# Patient Record
Sex: Female | Born: 1960
Health system: Southern US, Community
[De-identification: ages and names within clinical notes are randomized; demographics above are authoritative.]

## PROBLEM LIST (undated history)

## (undated) DIAGNOSIS — R011 Cardiac murmur, unspecified: Secondary | ICD-10-CM

## (undated) DIAGNOSIS — F32A Depression, unspecified: Secondary | ICD-10-CM

## (undated) DIAGNOSIS — F329 Major depressive disorder, single episode, unspecified: Secondary | ICD-10-CM

## (undated) DIAGNOSIS — T7840XA Allergy, unspecified, initial encounter: Secondary | ICD-10-CM

## (undated) DIAGNOSIS — M1990 Unspecified osteoarthritis, unspecified site: Secondary | ICD-10-CM

## (undated) DIAGNOSIS — F419 Anxiety disorder, unspecified: Secondary | ICD-10-CM

## (undated) HISTORY — PX: ABDOMINAL HYSTERECTOMY: SHX81

## (undated) HISTORY — DX: Allergy, unspecified, initial encounter: T78.40XA

## (undated) HISTORY — DX: Major depressive disorder, single episode, unspecified: F32.9

## (undated) HISTORY — DX: Cardiac murmur, unspecified: R01.1

## (undated) HISTORY — DX: Anxiety disorder, unspecified: F41.9

## (undated) HISTORY — DX: Unspecified osteoarthritis, unspecified site: M19.90

## (undated) HISTORY — DX: Depression, unspecified: F32.A

## (undated) HISTORY — PX: HERNIA REPAIR: SHX51

---

## 2006-04-11 ENCOUNTER — Emergency Department: Payer: Self-pay | Admitting: Internal Medicine

## 2007-04-25 ENCOUNTER — Other Ambulatory Visit: Payer: Self-pay

## 2007-04-25 ENCOUNTER — Emergency Department: Payer: Self-pay | Admitting: Emergency Medicine

## 2010-04-07 ENCOUNTER — Ambulatory Visit: Payer: Self-pay | Admitting: Family Medicine

## 2013-04-23 ENCOUNTER — Ambulatory Visit: Payer: Self-pay | Admitting: Family Medicine

## 2013-04-24 ENCOUNTER — Ambulatory Visit: Payer: Self-pay | Admitting: Family Medicine

## 2013-08-09 ENCOUNTER — Ambulatory Visit: Payer: Self-pay | Admitting: Gastroenterology

## 2014-08-26 ENCOUNTER — Other Ambulatory Visit: Payer: Self-pay | Admitting: Obstetrics and Gynecology

## 2014-08-26 DIAGNOSIS — Z139 Encounter for screening, unspecified: Secondary | ICD-10-CM

## 2014-08-29 ENCOUNTER — Ambulatory Visit
Admission: RE | Admit: 2014-08-29 | Discharge: 2014-08-29 | Disposition: A | Discharge status: Home / Self Care | Payer: PRIVATE HEALTH INSURANCE | Source: Ambulatory Visit | Attending: Obstetrics and Gynecology | Admitting: Obstetrics and Gynecology

## 2014-08-29 DIAGNOSIS — Z1231 Encounter for screening mammogram for malignant neoplasm of breast: Secondary | ICD-10-CM | POA: Insufficient documentation

## 2014-08-29 DIAGNOSIS — Z139 Encounter for screening, unspecified: Secondary | ICD-10-CM

## 2014-11-27 ENCOUNTER — Other Ambulatory Visit: Payer: Self-pay | Admitting: Family Medicine

## 2015-03-29 ENCOUNTER — Other Ambulatory Visit: Payer: Self-pay | Admitting: Family Medicine

## 2015-04-16 ENCOUNTER — Other Ambulatory Visit: Payer: Self-pay | Admitting: Family Medicine

## 2015-04-16 DIAGNOSIS — J309 Allergic rhinitis, unspecified: Secondary | ICD-10-CM

## 2015-04-16 MED ORDER — CETIRIZINE HCL 10 MG PO TABS: 10.0000 mg | ORAL_TABLET | Freq: Every day | ORAL | Status: DC

## 2015-04-20 ENCOUNTER — Telehealth: Payer: Self-pay | Admitting: Family Medicine

## 2015-04-20 NOTE — Telephone Encounter (Signed)
Pt called saying she seen you at [facility] on Thursday and you gave her an allergy medication .   hse said her insurance will not pay for it.  Please try something different.  She uses [facility]

## 2015-04-21 MED ORDER — LORATADINE 10 MG PO TABS: 10.0000 mg | ORAL_TABLET | Freq: Every day | ORAL | Status: DC

## 2015-04-21 NOTE — Telephone Encounter (Signed)
Will need to try Claritin(loratadine) with OTC Flonase nasal spray at bedtime.

## 2015-04-22 NOTE — Telephone Encounter (Signed)
Patient advised as directed below. 

## 2015-07-30 ENCOUNTER — Other Ambulatory Visit: Payer: Self-pay | Admitting: Family Medicine

## 2015-08-27 ENCOUNTER — Other Ambulatory Visit: Payer: Self-pay | Admitting: Obstetrics and Gynecology

## 2015-08-27 DIAGNOSIS — Z1231 Encounter for screening mammogram for malignant neoplasm of breast: Secondary | ICD-10-CM

## 2015-09-04 ENCOUNTER — Ambulatory Visit
Admission: RE | Admit: 2015-09-04 | Discharge: 2015-09-04 | Disposition: A | Discharge status: Home / Self Care | Payer: PRIVATE HEALTH INSURANCE | Source: Ambulatory Visit | Attending: Obstetrics and Gynecology | Admitting: Obstetrics and Gynecology

## 2015-09-04 DIAGNOSIS — Z1231 Encounter for screening mammogram for malignant neoplasm of breast: Secondary | ICD-10-CM

## 2015-09-07 DIAGNOSIS — I351 Nonrheumatic aortic (valve) insufficiency: Secondary | ICD-10-CM | POA: Insufficient documentation

## 2015-10-29 ENCOUNTER — Other Ambulatory Visit: Payer: Self-pay | Admitting: Family Medicine

## 2015-10-29 MED ORDER — ALBUTEROL SULFATE HFA 108 (90 BASE) MCG/ACT IN AERS: 2.0000 | INHALATION_SPRAY | Freq: Four times a day (QID) | RESPIRATORY_TRACT | 3 refills | Status: AC | PRN

## 2015-10-29 NOTE — Progress Notes (Signed)
Having flare of wheezing/asthma over the past week. Still taking allergy and asthma medications but ran out of the Albuterol. Recheck if having any fever or congestion. No wheezing at the present at [facility].

## 2015-11-30 ENCOUNTER — Other Ambulatory Visit: Payer: Self-pay | Admitting: Family Medicine

## 2016-03-24 ENCOUNTER — Other Ambulatory Visit: Payer: Self-pay | Admitting: Family Medicine

## 2016-03-24 MED ORDER — LORATADINE 10 MG PO TABS: 10.0000 mg | ORAL_TABLET | Freq: Every day | ORAL | 11 refills | Status: DC

## 2016-03-24 MED ORDER — TRAZODONE HCL 150 MG PO TABS: 150.0000 mg | ORAL_TABLET | Freq: Every day | ORAL | 6 refills | Status: DC

## 2016-03-31 ENCOUNTER — Other Ambulatory Visit: Payer: Self-pay | Admitting: Family Medicine

## 2016-03-31 DIAGNOSIS — M545 Low back pain, unspecified: Secondary | ICD-10-CM

## 2016-03-31 MED ORDER — CYCLOBENZAPRINE HCL 10 MG PO TABS: 5.0000 mg | ORAL_TABLET | Freq: Three times a day (TID) | ORAL | 0 refills | Status: DC | PRN

## 2016-03-31 MED ORDER — MELOXICAM 15 MG PO TABS: 15.0000 mg | ORAL_TABLET | Freq: Every day | ORAL | 0 refills | Status: DC

## 2016-03-31 NOTE — Progress Notes (Signed)
Started having acute low back pain/spasm on 03-27-16. No known injury. Examined at [facility] and having tenderness in the right lower back muscles without radiation, numbness or weakness. Treated with Meloxicam 15 mg qd and Cyclobenzaprine 10 mg TID with stretching exercises and moist heat applications. Will recheck at the [facility] in a week if no better. May need physical therapy referral.

## 2016-04-21 ENCOUNTER — Other Ambulatory Visit: Payer: Self-pay | Admitting: Family Medicine

## 2016-04-21 DIAGNOSIS — J0100 Acute maxillary sinusitis, unspecified: Secondary | ICD-10-CM

## 2016-04-21 DIAGNOSIS — J40 Bronchitis, not specified as acute or chronic: Secondary | ICD-10-CM

## 2016-04-21 MED ORDER — AMOXICILLIN-POT CLAVULANATE 875-125 MG PO TABS: 1.0000 | ORAL_TABLET | Freq: Two times a day (BID) | ORAL | 0 refills | Status: DC

## 2016-04-21 MED ORDER — PREDNISONE 5 MG PO TABS: 5.0000 mg | ORAL_TABLET | Freq: Every day | ORAL | 0 refills | Status: DC

## 2016-09-07 ENCOUNTER — Other Ambulatory Visit: Payer: Self-pay | Admitting: Obstetrics and Gynecology

## 2016-09-07 DIAGNOSIS — Z1231 Encounter for screening mammogram for malignant neoplasm of breast: Secondary | ICD-10-CM

## 2016-09-19 ENCOUNTER — Ambulatory Visit
Admission: RE | Admit: 2016-09-19 | Discharge: 2016-09-19 | Disposition: A | Discharge status: Home / Self Care | Payer: PRIVATE HEALTH INSURANCE | Source: Ambulatory Visit | Attending: Obstetrics and Gynecology | Admitting: Obstetrics and Gynecology

## 2016-09-19 DIAGNOSIS — Z1231 Encounter for screening mammogram for malignant neoplasm of breast: Secondary | ICD-10-CM | POA: Insufficient documentation

## 2016-10-25 ENCOUNTER — Other Ambulatory Visit: Payer: Self-pay | Admitting: Family Medicine

## 2017-07-25 ENCOUNTER — Other Ambulatory Visit: Payer: Self-pay | Admitting: Family Medicine

## 2017-07-25 MED ORDER — TRAZODONE HCL 150 MG PO TABS: 150.0000 mg | ORAL_TABLET | Freq: Every day | ORAL | 1 refills | Status: DC

## 2017-07-25 NOTE — Telephone Encounter (Signed)
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

## 2017-07-28 ENCOUNTER — Telehealth: Payer: Self-pay | Admitting: Family Medicine

## 2017-07-28 MED ORDER — TRAZODONE HCL 150 MG PO TABS: 150.0000 mg | ORAL_TABLET | Freq: Every day | ORAL | 0 refills | Status: DC

## 2017-07-28 NOTE — Telephone Encounter (Signed)
Patient advised. Trazodone RX sent to [facility]. Re-establish care appointment scheduled for 08/03/17

## 2017-07-28 NOTE — Telephone Encounter (Signed)
Can refill Trazodone 150 mg qd #30 until seen in the office to go over smoking cessation options and recheck depression.

## 2017-07-28 NOTE — Telephone Encounter (Signed)
Pt called first of the week asking if she could get a refill on trazodone.  Maurine Minister use to fill it for her when he came to [place].  She use to be a patient at [place] and here a long time ago.  Can we establish her as a patient and can she get a refill on the trazodone 150 mg.  She also wants something to help her quit smoking.  She uses [facility]  Pt's call back is [phone]  Barth Kirks

## 2017-08-02 ENCOUNTER — Other Ambulatory Visit: Payer: Self-pay

## 2017-08-03 ENCOUNTER — Encounter: Payer: Self-pay | Admitting: Family Medicine

## 2017-08-03 ENCOUNTER — Ambulatory Visit: Payer: 59 | Admitting: Family Medicine

## 2017-08-03 VITALS — BP 120/80 | HR 68 | Temp 98.2°F | Resp 16 | Ht 61.25 in | Wt 130.0 lb

## 2017-08-03 DIAGNOSIS — J301 Allergic rhinitis due to pollen: Secondary | ICD-10-CM

## 2017-08-03 DIAGNOSIS — J449 Chronic obstructive pulmonary disease, unspecified: Secondary | ICD-10-CM | POA: Diagnosis not present

## 2017-08-03 DIAGNOSIS — Z720 Tobacco use: Secondary | ICD-10-CM | POA: Diagnosis not present

## 2017-08-03 DIAGNOSIS — Z8739 Personal history of other diseases of the musculoskeletal system and connective tissue: Secondary | ICD-10-CM

## 2017-08-03 DIAGNOSIS — F418 Other specified anxiety disorders: Secondary | ICD-10-CM

## 2017-08-03 MED ORDER — CYCLOBENZAPRINE HCL 10 MG PO TABS: 5.0000 mg | ORAL_TABLET | Freq: Three times a day (TID) | ORAL | 0 refills | Status: AC | PRN

## 2017-08-03 MED ORDER — MELOXICAM 15 MG PO TABS
15.0000 mg | ORAL_TABLET | Freq: Every day | ORAL | 0 refills | Status: DC
Start: 2017-08-03 — End: 2022-05-05

## 2017-08-03 NOTE — Progress Notes (Signed)
Patient: Erin EvenerCynthia J Pickrell, Female    DOB: 1960/07/24, 57 y.o.   MRN: [id number] Visit Date: 08/03/2017  Today's Provider: Dortha Kernennis Jema Deegan, PA   Chief Complaint  Patient presents with  . New Patient (Initial Visit)   Subjective:   Re-establish: patient here today to re-establish patient reports feeling well. Patient reports she was seen by Maurine Ministerennis at [facility]. Patient reports that she will need for Maurine MinisterDennis to take over her prescription medications refills.   Patient also requesting help to quit smoking. Patient reports she restarted smoking about 11 years ago and has been smoking for a total of 25 years. Patient reports she smokes about a pack a day.   -----------------------------------------------------------------   Review of Systems  Constitutional: Negative.   HENT: Positive for sinus pressure, sneezing and voice change.   Eyes: Negative.   Respiratory: Negative.   Cardiovascular: Negative.   Gastrointestinal: Negative.   Endocrine: Negative.   Genitourinary: Negative.   Musculoskeletal: Positive for back pain.  Skin: Negative.   Allergic/Immunologic: Positive for environmental allergies.  Neurological: Negative.   Hematological: Negative.   Psychiatric/Behavioral: Negative.     Social History      She  reports that she has been smoking.  She has a 25.00 pack-year smoking history. She has never used smokeless tobacco. She reports that she does not drink alcohol.       Social History   Socioeconomic History  . Marital status: Divorced    Spouse name: Not on file  . Number of children: Not on file  . Years of education: Not on file  . Highest education level: Not on file  Occupational History  . Not on file  Social Needs  . Financial resource strain: Not on file  . Food insecurity:    Worry: Not on file    Inability: Not on file  . Transportation needs:    Medical: Not on file    Non-medical: Not on file  Tobacco Use  . Smoking status: Current Every  Day Smoker    Packs/day: 1.00    Years: 25.00    Pack years: 25.00  . Smokeless tobacco: Never Used  Substance and Sexual Activity  . Alcohol use: Never    Frequency: Never  . Drug use: Not on file  . Sexual activity: Not on file  Lifestyle  . Physical activity:    Days per week: Not on file    Minutes per session: Not on file  . Stress: Not on file  Relationships  . Social connections:    Talks on phone: Not on file    Gets together: Not on file    Attends religious service: Not on file    Active member of club or organization: Not on file    Attends meetings of clubs or organizations: Not on file    Relationship status: Not on file  Other Topics Concern  . Not on file  Social History Narrative  . Not on file   No past medical history on file.  Patient Active Problem List   Diagnosis Date Noted  . Aortic ejection murmur 09/07/2015   No past surgical history on file.  Family History        Family Status  Relation Name Status  . Mother  Deceased  . Father  Alive  . Sister  Alive  . Brother  Alive  . Son  Alive  . Neg Hx  (Not Specified)  Her family history includes Cancer in her mother; Heart disease in her father. There is no history of Breast cancer.     No Known Allergies  Current Outpatient Medications:  .  albuterol (PROVENTIL HFA;VENTOLIN HFA) 108 (90 Base) MCG/ACT inhaler, Inhale 2 puffs into the lungs every 6 (six) hours as needed for wheezing or shortness of breath., Disp: 1 Inhaler, Rfl: 3 .  cyclobenzaprine (FLEXERIL) 10 MG tablet, Take 0.5 tablets (5 mg total) by mouth 3 (three) times daily as needed for muscle spasms., Disp: 21 tablet, Rfl: 0 .  estradiol (ESTRACE) 1 MG tablet, TAKE ONE TABLET BY MOUTH ONCE DAILY, Disp: , Rfl:  .  HYDROcodone-acetaminophen (NORCO/VICODIN) 5-325 MG tablet, Take by mouth., Disp: , Rfl:  .  hydrOXYzine (VISTARIL) 25 MG capsule, Take 25 mg by mouth daily. As needed for Anxiety, Disp: , Rfl:  .  Magnesium 250 MG  TABS, Take by mouth., Disp: , Rfl:  .  meloxicam (MOBIC) 15 MG tablet, Take 1 tablet (15 mg total) by mouth daily., Disp: 30 tablet, Rfl: 0 .  Multiple Vitamin (MULTI-VITAMINS) TABS, Take by mouth., Disp: , Rfl:  .  traZODone (DESYREL) 150 MG tablet, Take 1 tablet (150 mg total) by mouth at bedtime., Disp: 30 tablet, Rfl: 0 .  vitamin B-12 (CYANOCOBALAMIN) 1000 MCG tablet, Take by mouth., Disp: , Rfl:    Patient Care Team: Shirley Bolle, Jodell Cipro, PA as PCP - General (Family Medicine)      Objective:   Vitals: BP 120/80 (BP Location: Right Arm, Patient Position: Sitting, Cuff Size: Normal)   Pulse 68   Temp 98.2 F (36.8 C)   Resp 16   Ht 5' 1.25" (1.556 m)   Wt 130 lb (59 kg)   SpO2 98%   BMI 24.36 kg/m    Vitals:   08/03/17 1334  BP: 120/80  Pulse: 68  Resp: 16  Temp: 98.2 F (36.8 C)  SpO2: 98%  Weight: 130 lb (59 kg)  Height: 5' 1.25" (1.556 m)    Physical Exam  Constitutional: She is oriented to person, place, and time. She appears well-developed and well-nourished. No distress.  HENT:  Head: Normocephalic and atraumatic.  Right Ear: Hearing normal.  Left Ear: Hearing normal.  Nose: Nose normal.  Eyes: Conjunctivae and lids are normal. Right eye exhibits no discharge. Left eye exhibits no discharge. No scleral icterus.  Neck: Normal range of motion. Neck supple.  Cardiovascular: Normal rate and regular rhythm.  Pulmonary/Chest: Effort normal and breath sounds normal. No respiratory distress.  Abdominal: Soft. Bowel sounds are normal.  Genitourinary:  Genitourinary Comments: Deferred to GYN.  Musculoskeletal: Normal range of motion.  Slight pulling discomfort in the right lower back. No neurologic deficit.  Lymphadenopathy:    She has no cervical adenopathy.  Neurological: She is alert and oriented to person, place, and time.  Skin: Skin is intact. No lesion and no rash noted.  Psychiatric: She has a normal mood and affect. Her speech is normal and behavior is  normal. Thought content normal.    Depression Screen PHQ 2/9 Scores 08/03/2017  PHQ - 2 Score 2  PHQ- 9 Score 7   Assessment & Plan:     Routine Health Maintenance and Physical Exam  Exercise Activities and Dietary recommendations Goals    Recommend 30 minute exercise program 3-4 days a week. Increase water intake.      There is no immunization history on file for this patient.  Health Maintenance  Topic Date Due  .  Hepatitis C Screening  05-29-1960  . HIV Screening  06/23/1977  . TETANUS/TDAP  06/23/1981  . PAP SMEAR  06/24/1983  . COLONOSCOPY  06/23/2012  . INFLUENZA VACCINE  09/28/2017  . MAMMOGRAM  09/20/2018    Discussed health benefits of physical activity, and encouraged her to engage in regular exercise appropriate for her age and condition.    -------------------------------------------------------------------- 1. Depression with anxiety History of depression and anxiety when mother died in 2012/07/24 from metastatic lung, kidney and liver cancer. Recheck labs. Still feels the use of Trazodone helps her sleep and have less depressive mood with anxiety. Gets annual physical by GYN (Dr. Yetta Barre) with history of post menopausal symptoms and hysterectomy (partial) for DUB in 07-25-2003. Still takes HRT. - CBC with Differential/Platelet - Comprehensive metabolic panel - TSH  2. Tobacco consumption Continues to smoke 1 ppd for more than 25 years. Wants to stop but having some trouble with cravings. Does not want to use any of the Chantix. Recommend tapering down and if labs normal, consider adding a little Welbutrin. - CBC with Differential/Platelet - Lipid panel  3. COPD not affecting current episode of care [facility]) Occasional dyspnea and wheezing with significant exertions. Needs to stop all smoking and refilled the Albuterol for wheezing and shortness of breath. Occasionally some clear to white sputum. Check CBC with diff. - CBC with Differential/Platelet  4. Seasonal allergic  rhinitis due to pollen Seasonal rhinitis with occasional wheezing in the Spring. Continues to use an OTC antihistamine for symptoms and Albuterol prn wheezing. Recheck labs and follow up prn. - CBC with Differential/Platelet - Comprehensive metabolic panel - TSH  5. Hx of chronic arthritis States she has had arthritis in the right lower back and left hip on x-rays in the past. Occasionally gets some spasm in the right back and finds the Flexeril will "unlock" the back. Recommend she refill the Meloxicam for inflammation and should recheck if no better in 2-3 weeks. - meloxicam (MOBIC) 15 MG tablet; Take 1 tablet (15 mg total) by mouth daily.  Dispense: 30 tablet; Refill: 0 - cyclobenzaprine (FLEXERIL) 10 MG tablet; Take 0.5 tablets (5 mg total) by mouth 3 (three) times daily as needed for muscle spasms.  Dispense: 21 tablet; Refill: 0    Dortha Kern, PA  [facility]

## 2017-08-04 ENCOUNTER — Other Ambulatory Visit: Payer: Self-pay | Admitting: Family Medicine

## 2017-08-04 ENCOUNTER — Telehealth: Payer: Self-pay

## 2017-08-04 DIAGNOSIS — F418 Other specified anxiety disorders: Secondary | ICD-10-CM

## 2017-08-04 DIAGNOSIS — Z720 Tobacco use: Secondary | ICD-10-CM

## 2017-08-04 LAB — CBC WITH DIFFERENTIAL/PLATELET
Basophils Absolute: 0 10*3/uL (ref 0.0–0.2)
Basos: 0 %
EOS (ABSOLUTE): 0.5 10*3/uL — ABNORMAL HIGH (ref 0.0–0.4)
EOS: 4 %
HEMATOCRIT: 42.2 % (ref 34.0–46.6)
HEMOGLOBIN: 14.4 g/dL (ref 11.1–15.9)
IMMATURE GRANULOCYTES: 0 %
Immature Grans (Abs): 0 10*3/uL (ref 0.0–0.1)
LYMPHS ABS: 5.7 10*3/uL — AB (ref 0.7–3.1)
Lymphs: 47 %
MCH: 30.3 pg (ref 26.6–33.0)
MCHC: 34.1 g/dL (ref 31.5–35.7)
MCV: 89 fL (ref 79–97)
MONOCYTES: 5 %
Monocytes Absolute: 0.6 10*3/uL (ref 0.1–0.9)
NEUTROS PCT: 44 %
Neutrophils Absolute: 5.4 10*3/uL (ref 1.4–7.0)
Platelets: 388 10*3/uL (ref 150–450)
RBC: 4.75 x10E6/uL (ref 3.77–5.28)
RDW: 13.2 % (ref 12.3–15.4)
WBC: 12.3 10*3/uL — AB (ref 3.4–10.8)

## 2017-08-04 LAB — COMPREHENSIVE METABOLIC PANEL
ALBUMIN: 4.6 g/dL (ref 3.5–5.5)
ALT: 14 IU/L (ref 0–32)
AST: 18 IU/L (ref 0–40)
Albumin/Globulin Ratio: 1.8 (ref 1.2–2.2)
Alkaline Phosphatase: 59 IU/L (ref 39–117)
BUN / CREAT RATIO: 11 (ref 9–23)
BUN: 7 mg/dL (ref 6–24)
Bilirubin Total: <0.2 mg/dL (ref 0.0–1.2)
CALCIUM: 9.7 mg/dL (ref 8.7–10.2)
CO2: 23 mmol/L (ref 20–29)
CREATININE: 0.62 mg/dL (ref 0.57–1.00)
Chloride: 104 mmol/L (ref 96–106)
GFR calc Af Amer: 117 mL/min/{1.73_m2} (ref >59)
GFR, EST NON AFRICAN AMERICAN: 102 mL/min/{1.73_m2} (ref >59)
GLOBULIN, TOTAL: 2.6 g/dL (ref 1.5–4.5)
Glucose: 95 mg/dL (ref 65–99)
Potassium: 4.3 mmol/L (ref 3.5–5.2)
SODIUM: 141 mmol/L (ref 134–144)
Total Protein: 7.2 g/dL (ref 6.0–8.5)

## 2017-08-04 LAB — TSH: TSH: 0.778 u[IU]/mL (ref 0.450–4.500)

## 2017-08-04 LAB — LIPID PANEL
CHOL/HDL RATIO: 2.8 ratio (ref 0.0–4.4)
Cholesterol, Total: 209 mg/dL — ABNORMAL HIGH (ref 100–199)
HDL: 76 mg/dL (ref >39)
LDL CALC: 101 mg/dL — AB (ref 0–99)
Triglycerides: 159 mg/dL — ABNORMAL HIGH (ref 0–149)
VLDL Cholesterol Cal: 32 mg/dL (ref 5–40)

## 2017-08-04 MED ORDER — BUPROPION HCL 75 MG PO TABS: 75.0000 mg | ORAL_TABLET | Freq: Two times a day (BID) | ORAL | 1 refills | Status: DC

## 2017-08-04 NOTE — Telephone Encounter (Signed)
Sent to the [facility] pharmacy for smoking cessation and depression with anxiety. Recommend follow up in 10-14 days to assess progress.

## 2017-08-04 NOTE — Telephone Encounter (Signed)
Patient was advised or message.  She asked if you were going to call in the Wellbutrin for her.  She was told we would call her if there was a problem in doing this otherwise it would be sent to her pharmacy and she could check with them later. ED   ----- Message from Tamsen Roersennis E Chrismon, PA sent at 08/04/2017  8:15 AM EDT ----- Blood tests essentially normal except WBC count a little elevated. If no cough or congestion, may be a normal fluctuation and should return to normal levels. Recheck if any fever develops. Continue present medications.

## 2017-08-04 NOTE — Telephone Encounter (Signed)
-----   Message from Tamsen Roersennis E Chrismon, [place] sent at 08/04/2017  8:15 AM EDT ----- Blood tests essentially normal except WBC count a little elevated. If no cough or congestion, may be a normal fluctuation and should return to normal levels. Recheck if any fever develops. Continue present medications.

## 2017-08-04 NOTE — Progress Notes (Signed)
Requests Bupropion 75 mg BID to help with smoking cessation and depression with anxiety not completely controlled with Trazodone. Recheck in 10-14 days to assess progress.

## 2017-09-14 ENCOUNTER — Other Ambulatory Visit: Payer: Self-pay | Admitting: Obstetrics and Gynecology

## 2017-09-14 DIAGNOSIS — Z1231 Encounter for screening mammogram for malignant neoplasm of breast: Secondary | ICD-10-CM

## 2017-09-28 ENCOUNTER — Ambulatory Visit
Admission: RE | Admit: 2017-09-28 | Discharge: 2017-09-28 | Disposition: A | Discharge status: Home / Self Care | Payer: 59 | Source: Ambulatory Visit | Attending: Obstetrics and Gynecology | Admitting: Obstetrics and Gynecology

## 2017-09-28 DIAGNOSIS — Z1231 Encounter for screening mammogram for malignant neoplasm of breast: Secondary | ICD-10-CM | POA: Insufficient documentation

## 2017-11-20 ENCOUNTER — Other Ambulatory Visit: Payer: Self-pay | Admitting: Family Medicine

## 2017-12-24 ENCOUNTER — Other Ambulatory Visit: Payer: Self-pay | Admitting: Family Medicine

## 2018-12-16 ENCOUNTER — Other Ambulatory Visit: Payer: Self-pay | Admitting: Family Medicine

## 2018-12-28 ENCOUNTER — Telehealth: Payer: Self-pay | Admitting: Family Medicine

## 2018-12-28 NOTE — Telephone Encounter (Signed)
Called and spoke with patient. She had fallen Yesterday and wanted something called in for her back pain, She was notified that in order for the provider to prescribe anything for her pain she would need to be seen in person. She was unable to come into the office today but if not better by Monday will call for an appointment. She was told that if she is not better before Monday that the [facility] or [facility] clinic does walk ins or if it was worse to goto the ED. She gave verbal understanding.

## 2018-12-28 NOTE — Telephone Encounter (Signed)
Pt called saying she fell on her knees yesterday and her knees are ok but her lower and mid back hurts today.  She wasn't to know if dennis will call in something for muscle pain  [place]  CB#  [phone]  Con Memos

## 2019-03-24 ENCOUNTER — Other Ambulatory Visit: Payer: Self-pay | Admitting: Family Medicine

## 2019-03-24 NOTE — Telephone Encounter (Signed)
Requested medication (s) are due for refill today: yes  Requested medication (s) are on the active medication list: yes  Last refill:  12/16/18  Future visit scheduled: no  Notes to clinic:  pt needs appointment   Requested Prescriptions  Pending Prescriptions Disp Refills   traZODone (DESYREL) 150 MG tablet [Pharmacy Med Name: traZODone HCl 150 MG Oral Tablet] 90 tablet 0    Sig: TAKE 1 TABLET BY MOUTH AT BEDTIME *NEEDS TO SCHEDULE FOLLOW UP IN THE NEXT 4 WEEKS*      Psychiatry: Antidepressants - Serotonin Modulator Failed - 03/24/2019  8:18 AM      Failed - Valid encounter within last 6 months    Recent Outpatient Visits           1 year ago Depression with anxiety   [facility] Chrismon, Jodell Cipro, [place]

## 2019-04-28 ENCOUNTER — Other Ambulatory Visit: Payer: Self-pay | Admitting: Family Medicine

## 2019-04-30 ENCOUNTER — Other Ambulatory Visit: Payer: Self-pay | Admitting: Family Medicine

## 2019-05-31 ENCOUNTER — Ambulatory Visit: Payer: PRIVATE HEALTH INSURANCE | Attending: Internal Medicine

## 2019-05-31 DIAGNOSIS — Z23 Encounter for immunization: Secondary | ICD-10-CM

## 2019-05-31 NOTE — Progress Notes (Signed)
   Covid-19 Vaccination Clinic  Name:  Erin Wise    MRN: [id number] DOB: 10/29/1960  05/31/2019  [name] was observed post Covid-19 immunization for 15 minutes without incident. She was provided with Vaccine Information Sheet and instruction to access the V-Safe system.   [name] was instructed to call 911 with any severe reactions post vaccine: Marland Kitchen Difficulty breathing  . Swelling of face and throat  . A fast heartbeat  . A bad rash all over body  . Dizziness and weakness   Immunizations Administered    Name Date Dose VIS Date Route   Pfizer COVID-19 Vaccine 05/31/2019  8:23 AM 0.3 mL 02/08/2019 Intramuscular   Manufacturer: [employer], [employer]   Lot: [id number]   NDC: [address]-4

## 2019-06-26 ENCOUNTER — Ambulatory Visit: Payer: PRIVATE HEALTH INSURANCE | Attending: Internal Medicine

## 2019-06-26 DIAGNOSIS — Z23 Encounter for immunization: Secondary | ICD-10-CM

## 2019-06-26 NOTE — Progress Notes (Signed)
   Covid-19 Vaccination Clinic  Name:  MAJESTY STEHLIN    MRN: [id number] DOB: 07/08/1960  06/26/2019  Ms. Kroner was observed post Covid-19 immunization for 15 minutes without incident. She was provided with Vaccine Information Sheet and instruction to access the V-Safe system.   Ms. Bentsen was instructed to call 911 with any severe reactions post vaccine: Marland Kitchen Difficulty breathing  . Swelling of face and throat  . A fast heartbeat  . A bad rash all over body  . Dizziness and weakness   Immunizations Administered    Name Date Dose VIS Date Route   Pfizer COVID-19 Vaccine 06/26/2019  8:04 AM 0.3 mL 04/24/2018 Intramuscular   Manufacturer: [employer], [employer]   Lot: [id number]   NDC: [address]-5

## 2019-11-27 ENCOUNTER — Other Ambulatory Visit: Payer: Self-pay | Admitting: Certified Nurse Midwife

## 2019-11-27 DIAGNOSIS — Z1231 Encounter for screening mammogram for malignant neoplasm of breast: Secondary | ICD-10-CM

## 2019-12-19 ENCOUNTER — Other Ambulatory Visit: Payer: Self-pay

## 2019-12-19 ENCOUNTER — Ambulatory Visit
Admission: RE | Admit: 2019-12-19 | Discharge: 2019-12-19 | Disposition: A | Discharge status: Home / Self Care | Payer: BC Managed Care – PPO | Source: Ambulatory Visit | Attending: Certified Nurse Midwife | Admitting: Certified Nurse Midwife

## 2019-12-19 DIAGNOSIS — Z1231 Encounter for screening mammogram for malignant neoplasm of breast: Secondary | ICD-10-CM | POA: Insufficient documentation

## 2021-06-23 IMAGING — MG DIGITAL SCREENING BILAT W/ TOMO W/ CAD
6 of 10 series · 6 of 30 positions shown · non-contrast
Comparison: Previous exam(s).

CLINICAL DATA: Screening.

EXAM:
DIGITAL SCREENING BILATERAL MAMMOGRAM WITH TOMO AND CAD

[L MLO synth-2D]
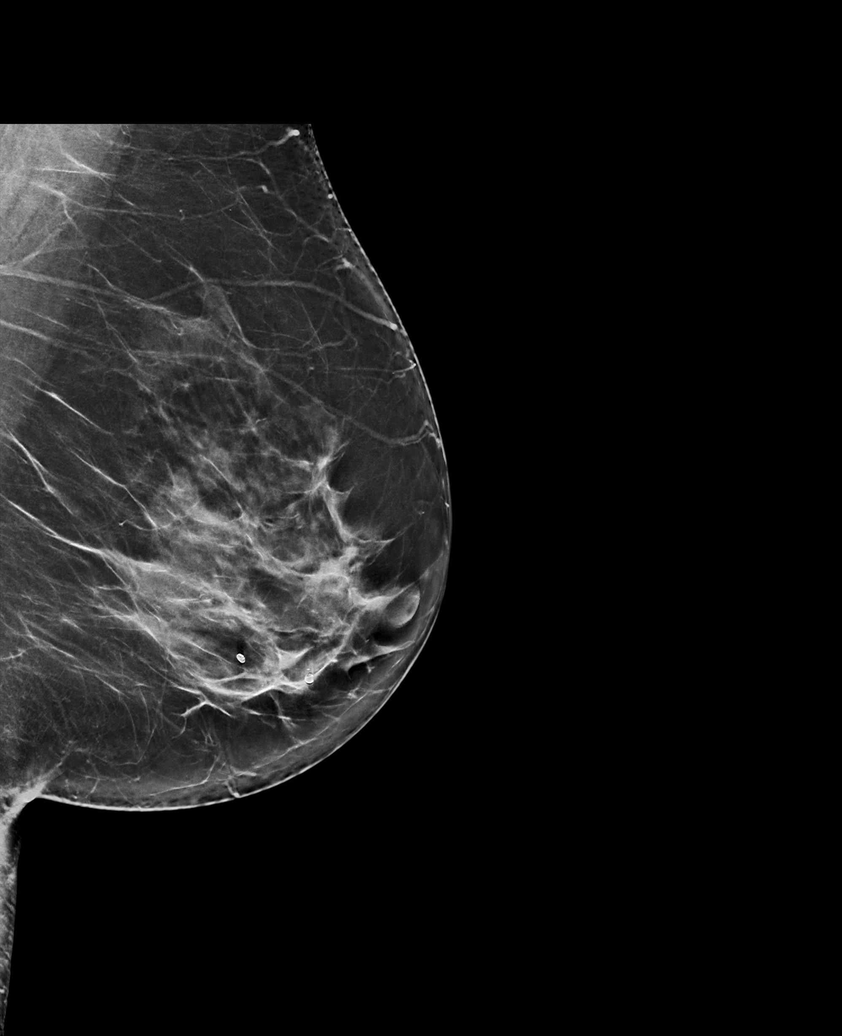

[R CC synth-2D (1 of 2)]
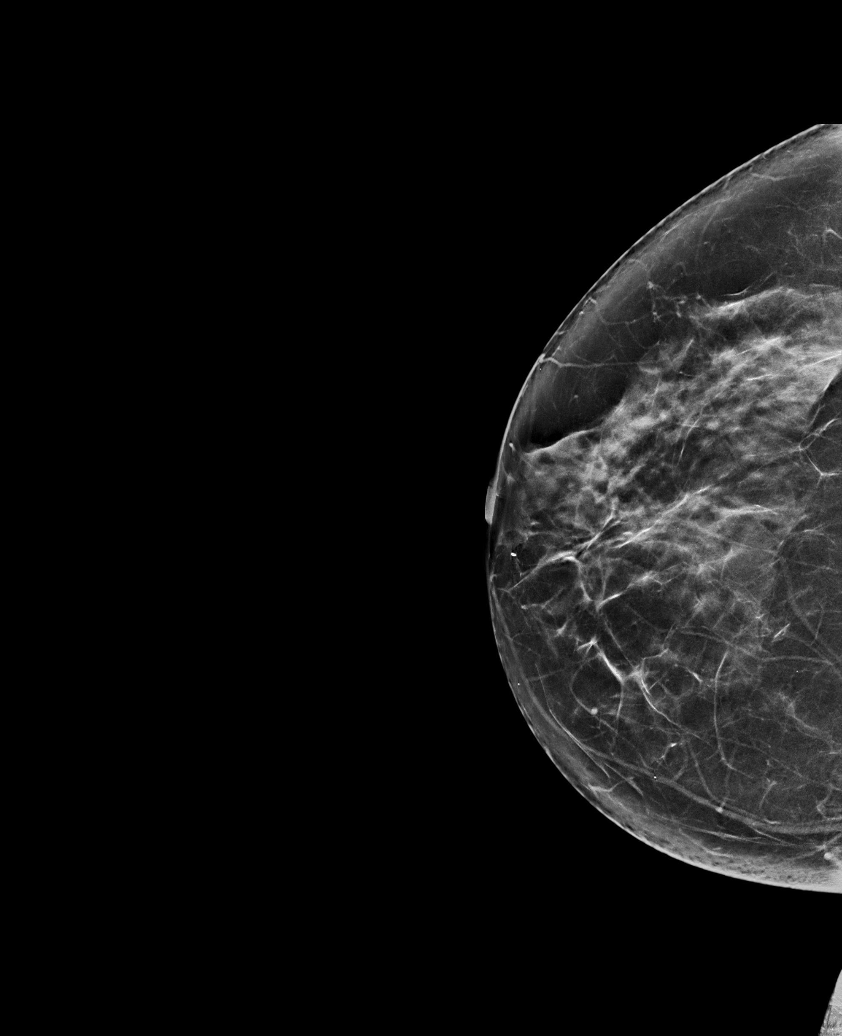

[R MLO synth-2D]
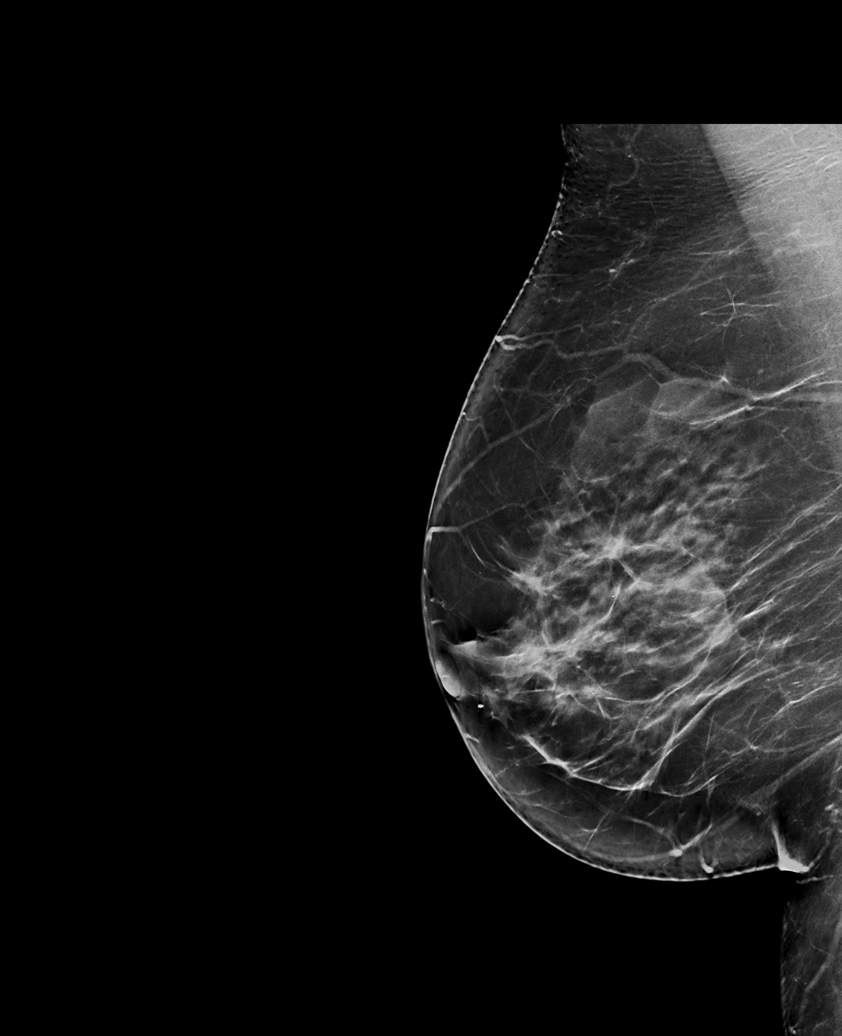

[L CC synth-2D]
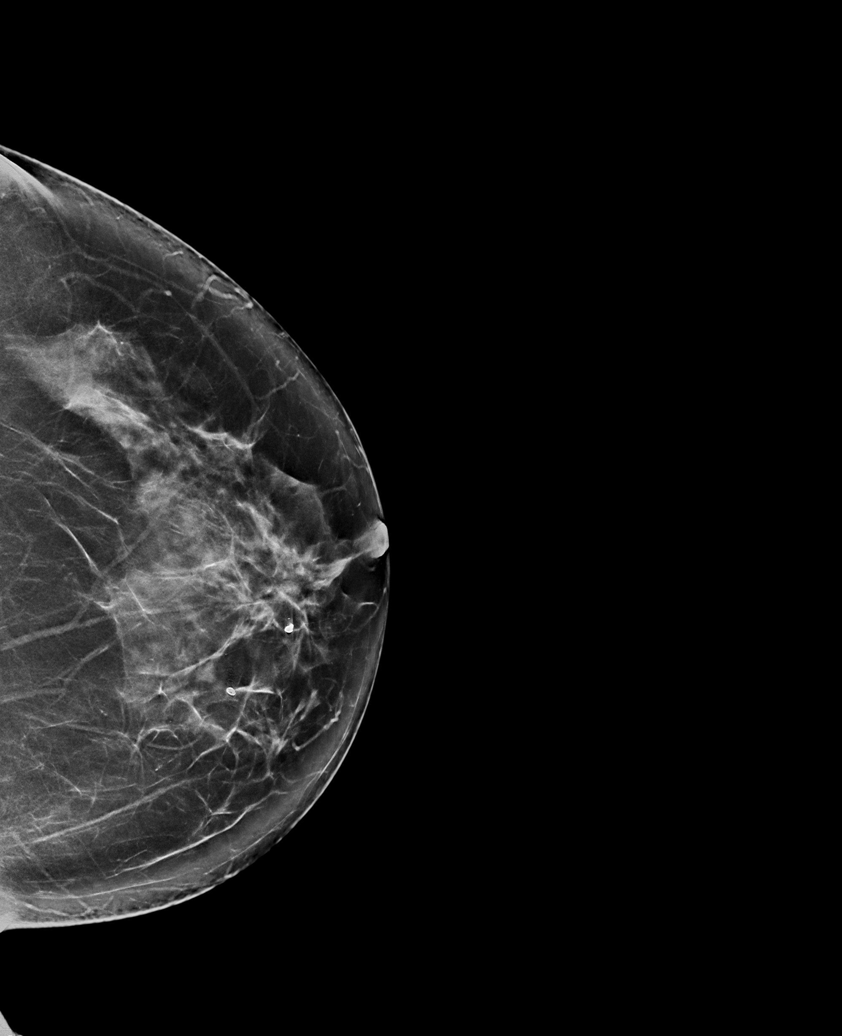

[R CC synth-2D (2 of 2)]
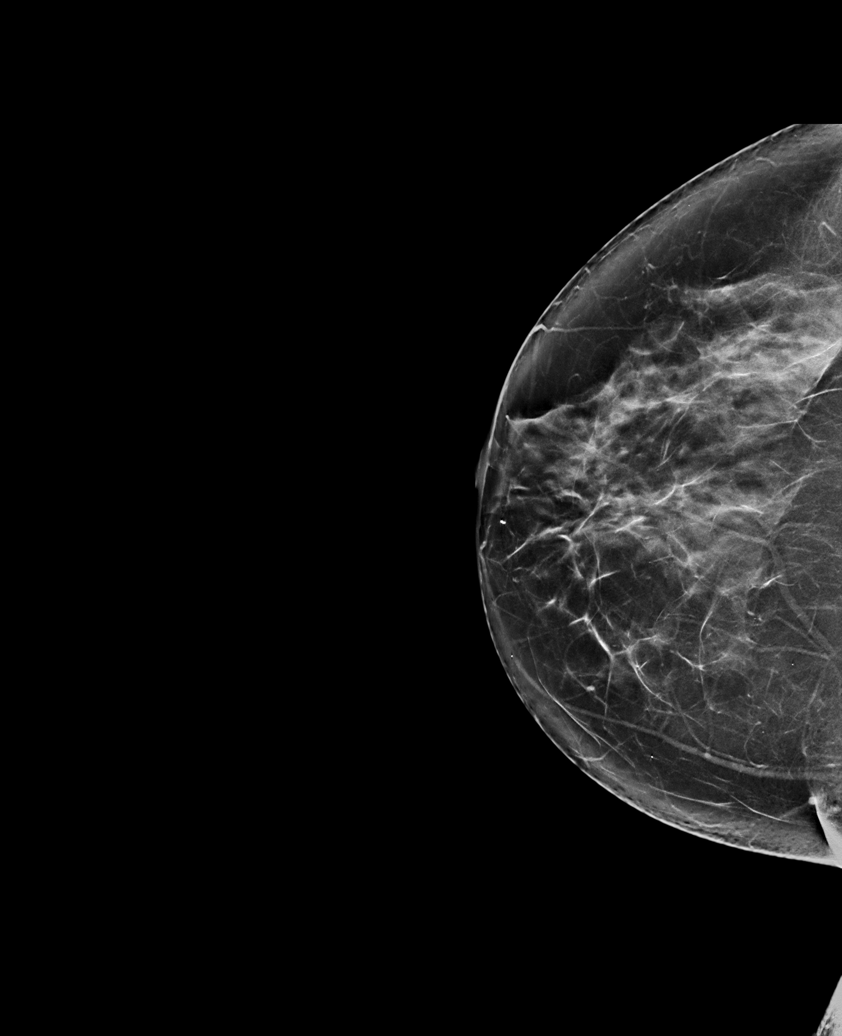

[R MLO tomo · tomo slice 45/88.0]
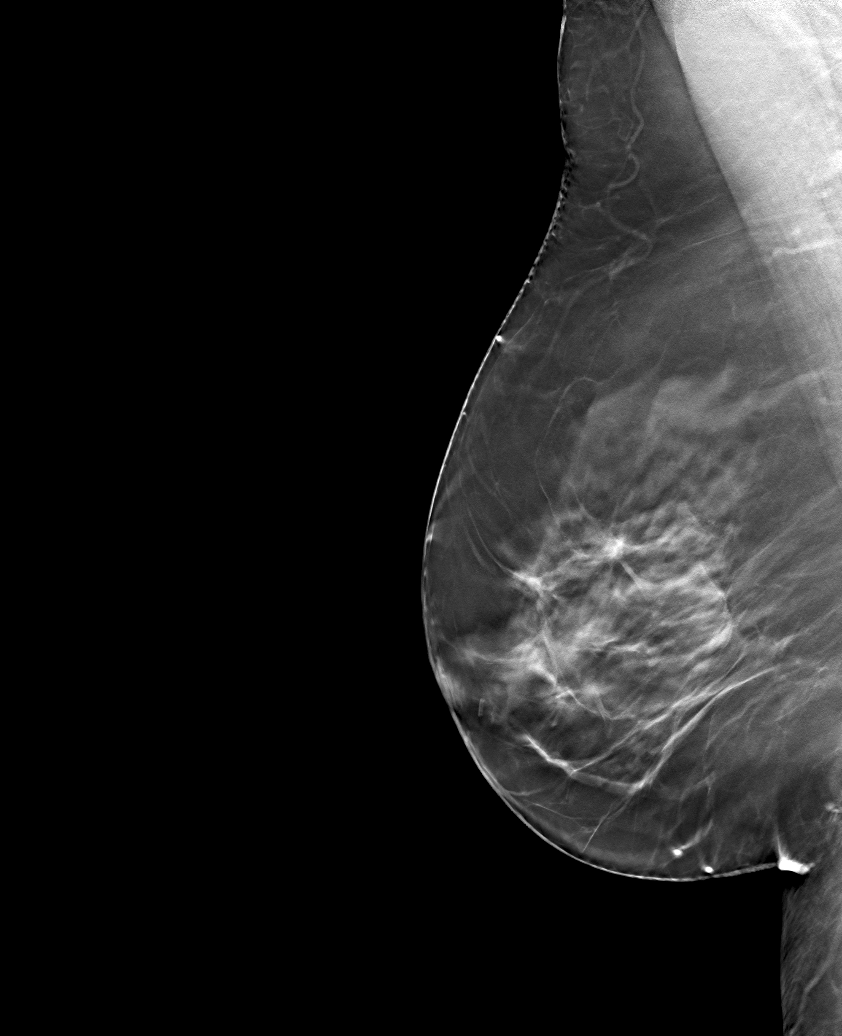

[6 of 30 positions shown; findings below may reference images not displayed]

ACR Breast Density Category c: The breast tissue is heterogeneously
dense, which may obscure small masses.
FINDINGS: There are no findings suspicious for malignancy. Images were
processed with CAD.
IMPRESSION: No mammographic evidence of malignancy. A result letter of this
screening mammogram will be mailed directly to the patient.

RECOMMENDATION:
Screening mammogram in one year. (Code:[id number])

BI-RADS CATEGORY  1: Negative.

## 2021-10-05 ENCOUNTER — Emergency Department: Payer: BC Managed Care – PPO

## 2021-10-05 ENCOUNTER — Emergency Department
Admission: EM | Admit: 2021-10-05 | Discharge: 2021-10-05 | Disposition: A | Discharge status: Home / Self Care | Payer: BC Managed Care – PPO | Attending: Emergency Medicine | Admitting: Emergency Medicine

## 2021-10-05 ENCOUNTER — Other Ambulatory Visit: Payer: Self-pay

## 2021-10-05 DIAGNOSIS — K5732 Diverticulitis of large intestine without perforation or abscess without bleeding: Secondary | ICD-10-CM | POA: Diagnosis not present

## 2021-10-05 DIAGNOSIS — D72829 Elevated white blood cell count, unspecified: Secondary | ICD-10-CM | POA: Insufficient documentation

## 2021-10-05 DIAGNOSIS — R1030 Lower abdominal pain, unspecified: Secondary | ICD-10-CM | POA: Diagnosis present

## 2021-10-05 DIAGNOSIS — K5792 Diverticulitis of intestine, part unspecified, without perforation or abscess without bleeding: Secondary | ICD-10-CM

## 2021-10-05 LAB — URINALYSIS, ROUTINE W REFLEX MICROSCOPIC
Glucose, UA: NEGATIVE mg/dL
Hgb urine dipstick: NEGATIVE
Ketones, ur: 20 mg/dL — AB
Leukocytes,Ua: NEGATIVE
Nitrite: NEGATIVE
Protein, ur: 30 mg/dL — AB
Specific Gravity, Urine: 1.035 — ABNORMAL HIGH (ref 1.005–1.030)
pH: 5 (ref 5.0–8.0)

## 2021-10-05 LAB — CBC
HCT: 37.9 % (ref 36.0–46.0)
Hemoglobin: 12.9 g/dL (ref 12.0–15.0)
MCH: 29.3 pg (ref 26.0–34.0)
MCHC: 34 g/dL (ref 30.0–36.0)
MCV: 85.9 fL (ref 80.0–100.0)
Platelets: 329 10*3/uL (ref 150–400)
RBC: 4.41 MIL/uL (ref 3.87–5.11)
RDW: 12.2 % (ref 11.5–15.5)
WBC: 17.1 10*3/uL — ABNORMAL HIGH (ref 4.0–10.5)
nRBC: 0 % (ref 0.0–0.2)

## 2021-10-05 LAB — COMPREHENSIVE METABOLIC PANEL
ALT: 15 U/L (ref 0–44)
AST: 19 U/L (ref 15–41)
Albumin: 4.2 g/dL (ref 3.5–5.0)
Alkaline Phosphatase: 69 U/L (ref 38–126)
Anion gap: 10 (ref 5–15)
BUN: 15 mg/dL (ref 6–20)
CO2: 21 mmol/L — ABNORMAL LOW (ref 22–32)
Calcium: 8.9 mg/dL (ref 8.9–10.3)
Chloride: 102 mmol/L (ref 98–111)
Creatinine, Ser: 0.47 mg/dL (ref 0.44–1.00)
GFR, Estimated: >60 mL/min (ref >60)
Glucose, Bld: 107 mg/dL — ABNORMAL HIGH (ref 70–99)
Potassium: 3.8 mmol/L (ref 3.5–5.1)
Sodium: 133 mmol/L — ABNORMAL LOW (ref 135–145)
Total Bilirubin: 0.7 mg/dL (ref 0.3–1.2)
Total Protein: 8 g/dL (ref 6.5–8.1)

## 2021-10-05 LAB — LIPASE, BLOOD: Lipase: 40 U/L (ref 11–51)

## 2021-10-05 MED ORDER — IOHEXOL 300 MG/ML  SOLN
100.0000 mL | Freq: Once | INTRAMUSCULAR | Status: AC | PRN
  Administered 2021-10-05: 100 mL via INTRAVENOUS

## 2021-10-05 MED ORDER — SULFAMETHOXAZOLE-TRIMETHOPRIM 800-160 MG PO TABS
1.0000 | ORAL_TABLET | Freq: Once | ORAL | Status: AC
  Administered 2021-10-05: 1 via ORAL
  Filled 2021-10-05: qty 1

## 2021-10-05 MED ORDER — ONDANSETRON 4 MG PO TBDP
4.0000 mg | ORAL_TABLET | Freq: Once | ORAL | Status: AC
Start: 2021-10-05 — End: 2021-10-05
  Administered 2021-10-05: 4 mg via ORAL
  Filled 2021-10-05: qty 1

## 2021-10-05 MED ORDER — SODIUM CHLORIDE 0.9 % IV BOLUS
1000.0000 mL | Freq: Once | INTRAVENOUS | Status: AC
  Administered 2021-10-05: 1000 mL via INTRAVENOUS

## 2021-10-05 MED ORDER — SULFAMETHOXAZOLE-TRIMETHOPRIM 800-160 MG PO TABS: 1.0000 | ORAL_TABLET | Freq: Two times a day (BID) | ORAL | 0 refills | Status: AC

## 2021-10-05 MED ORDER — MORPHINE SULFATE (PF) 4 MG/ML IV SOLN
4.0000 mg | Freq: Once | INTRAVENOUS | Status: AC
  Administered 2021-10-05: 4 mg via INTRAVENOUS
  Filled 2021-10-05: qty 1

## 2021-10-05 MED ORDER — HYDROCODONE-ACETAMINOPHEN 5-325 MG PO TABS: 1.0000 | ORAL_TABLET | Freq: Three times a day (TID) | ORAL | 0 refills | Status: AC | PRN

## 2021-10-05 MED ORDER — METRONIDAZOLE 500 MG PO TABS
500.0000 mg | ORAL_TABLET | Freq: Once | ORAL | Status: AC
Start: 2021-10-05 — End: 2021-10-05
  Administered 2021-10-05: 500 mg via ORAL
  Filled 2021-10-05: qty 1

## 2021-10-05 MED ORDER — ONDANSETRON 4 MG PO TBDP: 4.0000 mg | ORAL_TABLET | Freq: Three times a day (TID) | ORAL | 0 refills | Status: AC | PRN

## 2021-10-05 MED ORDER — METRONIDAZOLE 500 MG PO TABS: 500.0000 mg | ORAL_TABLET | Freq: Two times a day (BID) | ORAL | 0 refills | Status: AC

## 2021-10-05 MED ORDER — METOCLOPRAMIDE HCL 5 MG/ML IJ SOLN
10.0000 mg | Freq: Once | INTRAMUSCULAR | Status: AC
  Administered 2021-10-05: 10 mg via INTRAVENOUS
  Filled 2021-10-05: qty 2

## 2021-10-05 NOTE — ED Triage Notes (Signed)
Pt here from [facility] with RLQ abd pain x3 days. Pt has guarding with exam. Pt states nausea with no vomiting.

## 2021-10-05 NOTE — ED Provider Triage Note (Signed)
Emergency Medicine Provider Triage Evaluation Note  Erin Wise, a 62 y.o. female  was evaluated in triage.   She presents to the ED from [facility].  Patient has had intermittent episodes of abdominal pain and diarrhea since June.  She notes lower abdominal discomfort and cramping but has noted as decrease in the volume of her bowel movements.  She denies any bright red blood per rectum or any melanotic stools.  She also denies any fevers, chills, or sweats.  Review of Systems  Positive: Abdominal pain, diarrhea Negative: FCS  Physical Exam  BP 123/68   Pulse 75   Temp 99.6 F (37.6 C) (Oral)   Resp 18   Ht 5\' 1"  (1.549 m)   Wt 66.7 kg   SpO2 95%   BMI 27.78 kg/m  Gen:   Awake, no distress NAD Resp:  Normal effort CTA MSK:   Moves extremities without difficulty  ABD:  Soft, Nontender  Medical Decision Making  Medically screening exam initiated at 4:53 PM.  Appropriate orders placed.  DHRITI FALES was informed that the remainder of the evaluation will be completed by another provider, this initial triage assessment does not replace that evaluation, and the importance of remaining in the ED until their evaluation is complete.  Patient to the ED for evaluation of lower abdominal pain with 2 months of intermittent episodes of diarrhea.  She presents to the ED from [facility] for evaluation.   [facility], PA-C 10/05/21 1759

## 2021-10-05 NOTE — ED Triage Notes (Signed)
Pt was brought over by [facility]. She has been experiencing abdominal pain and headaches. Pt states she has had diarrhea for months and over the past few days she has been going just as frequently but not getting much out.

## 2021-10-05 NOTE — Discharge Instructions (Addendum)
Your exam, labs, and CT confirmed diverticulitis.  No evidence of any abscess or rupture of your colon.  You are stable for outpatient management.  Take the 2 antibiotics as directed.  You may start with a clear liquid diet and advance your intake as tolerated.  Take the pain medicine nausea medicine as needed.  Follow-up with GI for hospital follow-up and for your routine screening colonoscopy.  Return to the ED if needed.

## 2021-10-05 NOTE — ED Provider Notes (Signed)
[facility] Emergency Department Provider Note     Event Date/Time   First MD Initiated Contact with Patient 10/05/21 2014     (approximate)   History   Abdominal Pain and Headache   HPI  Erin Wise is a 61 y.o. female who presents to the ED from [facility].  Patient has had intermittent episodes of abdominal pain and diarrhea since June.  She notes lower abdominal discomfort and cramping but has noted as decrease in the volume of her bowel movements.  She denies any bright red blood per rectum or any melanotic stools.  She also denies any fevers, chills, or sweats.     Physical Exam   Triage Vital Signs: ED Triage Vitals  Enc Vitals Group     BP 10/05/21 1334 138/71     Pulse Rate 10/05/21 1334 80     Resp 10/05/21 1334 18     Temp 10/05/21 1334 99.1 F (37.3 C)     Temp Source 10/05/21 1334 Oral     SpO2 10/05/21 1334 96 %     Weight 10/05/21 1345 147 lb (66.7 kg)     Height 10/05/21 1345 5\' 1"  (1.549 m)     Head Circumference --      Peak Flow --      Pain Score 10/05/21 1344 7     Pain Loc --      Pain Edu? --      Excl. in GC? --     Most recent vital signs: Vitals:   10/05/21 2134 10/05/21 2258  BP: 117/64 120/65  Pulse: 77 72  Resp: 18 17  Temp: 97.9 F (36.6 C)   SpO2: 98% 99%    General Awake, no distress. NAD CV:  Good peripheral perfusion.  RESP:  Normal effort.  ABD:  No distention. Soft, mildly tender to palp over the lower quadrants. No rebound, guarding, or rigidity   ED Results / Procedures / Treatments   Labs (all labs ordered are listed, but only abnormal results are displayed) Labs Reviewed  COMPREHENSIVE METABOLIC PANEL - Abnormal; Notable for the following components:      Result Value   Sodium 133 (*)    CO2 21 (*)    Glucose, Bld 107 (*)    All other components within normal limits  CBC - Abnormal; Notable for the following components:   WBC 17.1 (*)    All other components within normal limits   URINALYSIS, ROUTINE W REFLEX MICROSCOPIC - Abnormal; Notable for the following components:   Color, Urine AMBER (*)    APPearance HAZY (*)    Specific Gravity, Urine 1.035 (*)    Bilirubin Urine SMALL (*)    Ketones, ur 20 (*)    Protein, ur 30 (*)    Bacteria, UA FEW (*)    All other components within normal limits  GASTROINTESTINAL PANEL BY PCR, STOOL (REPLACES STOOL CULTURE)  C DIFFICILE QUICK SCREEN W PCR REFLEX    LIPASE, BLOOD     EKG   RADIOLOGY  I personally viewed and evaluated these images as part of my medical decision making, as well as reviewing the written report by the radiologist.  ED Provider Interpretation: evidence of diverticulitis   CT Abdomen Pelvis W Contrast  Result Date: 10/05/2021 CLINICAL DATA:  Abdominal pain and diarrhea. EXAM: CT ABDOMEN AND PELVIS WITH CONTRAST TECHNIQUE: Multidetector CT imaging of the abdomen and pelvis was performed using the standard protocol following bolus administration  of intravenous contrast. RADIATION DOSE REDUCTION: This exam was performed according to the departmental dose-optimization program which includes automated exposure control, adjustment of the mA and/or kV according to patient size and/or use of iterative reconstruction technique. CONTRAST:  OMNIPAQUE IOHEXOL 300 MG/ML  SOLN COMPARISON:  None Available. FINDINGS: Lower chest: Mild atelectatic changes are seen within the bilateral lung bases. 3 mm noncalcified lung nodules versus focal atelectasis are seen within the posterior aspects of the bilateral lung bases (axial CT images 5 and 13, CT series 5). Hepatobiliary: No focal liver abnormality is seen. No gallstones, gallbladder wall thickening, or biliary dilatation. Pancreas: Unremarkable. No pancreatic ductal dilatation or surrounding inflammatory changes. Spleen: Normal in size without focal abnormality. Adrenals/Urinary Tract: Adrenal glands are unremarkable. Kidneys are normal, without renal calculi, focal  lesion, or hydronephrosis. Bladder is unremarkable. Stomach/Bowel: There is a small hiatal hernia. The appendix is not visualized. No evidence of bowel dilatation. Markedly inflamed diverticula are seen within the mid sigmoid colon. There is no evidence of associated perforation or abscess. Vascular/Lymphatic: No significant vascular findings are present. No enlarged abdominal or pelvic lymph nodes. Reproductive: Uterus and bilateral adnexa are unremarkable. Other: No abdominal wall hernia or abnormality. No abdominopelvic ascites. Musculoskeletal: No acute or significant osseous findings. IMPRESSION: 1. Marked severity sigmoid diverticulitis without evidence of associated perforation or abscess. 2. Small hiatal hernia. 3. 3 mm posterior pleural based noncalcified lung nodules versus focal atelectasis. No follow-up needed if patient is low-risk (and has no known or suspected primary neoplasm). Non-contrast chest CT can be considered in 12 months if patient is high-risk. This recommendation follows the consensus statement: Guidelines for Management of Incidental Pulmonary Nodules Detected on CT Images: From the Fleischner Society 2017; Radiology 2017; 284:228-243. Electronically Signed   By: Aram Candela M.D.   On: 10/05/2021 21:32     PROCEDURES:  Critical Care performed: No  Procedures   MEDICATIONS ORDERED IN ED: Medications  ondansetron (ZOFRAN-ODT) disintegrating tablet 4 mg (4 mg Oral Given 10/05/21 1759)  sodium chloride 0.9 % bolus 1,000 mL (0 mLs Intravenous Stopped 10/05/21 2159)  metoCLOPramide (REGLAN) injection 10 mg (10 mg Intravenous Given 10/05/21 2128)  iohexol (OMNIPAQUE) 300 MG/ML solution 100 mL (100 mLs Intravenous Contrast Given 10/05/21 2115)  morphine (PF) 4 MG/ML injection 4 mg (4 mg Intravenous Given 10/05/21 2254)  sulfamethoxazole-trimethoprim (BACTRIM DS) 800-160 MG per tablet 1 tablet (1 tablet Oral Given 10/05/21 2254)  metroNIDAZOLE (FLAGYL) tablet 500 mg (500 mg Oral Given  10/05/21 2254)     IMPRESSION / MDM / ASSESSMENT AND PLAN / ED COURSE  I reviewed the triage vital signs and the nursing notes.                              Differential diagnosis includes, but is not limited to, ovarian cyst, ovarian torsion, acute appendicitis, diverticulitis, urinary tract infection/pyelonephritis, endometriosis, bowel obstruction, colitis, renal colic, gastroenteritis, hernia, etc.  Patient's presentation is most consistent with acute complicated illness / injury requiring diagnostic workup.  To the ED with several weeks of intermittent lower abdominal pain and diarrhea.  She presents to the ED from local urgent care for persistent symptoms.  Patient presents in no acute distress but is found on evaluation to have an elevated white count of 17.  Mild ketosis on her UA but no other significant electrolyte abnormalities.  She is evaluated with a CT scan which does confirm some sigmoid diverticulitis.  No  evidence of perforation or abscess.  Patient's been stable throughout her course in the ED, and was given a fluid bolus as well as IV medication.  She does report significant improvement of her symptoms at this time patient tolerated p.o. challenge in the interim.  We discussed the management of her acute diverticulitis including both admission for IV antibiotics and bowel rest.  We also discussed the patient stable course and lack of comorbidities making her reasonable for outpatient course.  She was agreeable and verbalized that she preferred to be at home if possible.  As such, the patient be discharged with medications for outpatient management.  Patient's diagnosis is consistent with diverticulitis. Patient will be discharged home with prescriptions for Norco, Zofran, Bactrim, and metronidazole. Patient is to follow up with GI medicine for hospital follow-up and screening colonoscopy as needed or otherwise directed. Patient is given strict ED precautions to return to the ED for any  worsening or new symptoms.     FINAL CLINICAL IMPRESSION(S) / ED DIAGNOSES   Final diagnoses:  Diverticulitis     Rx / DC Orders   ED Discharge Orders          Ordered    sulfamethoxazole-trimethoprim (BACTRIM DS) 800-160 MG tablet  2 times daily        10/05/21 2250    metroNIDAZOLE (FLAGYL) 500 MG tablet  2 times daily        10/05/21 2250    HYDROcodone-acetaminophen (NORCO) 5-325 MG tablet  3 times daily PRN        10/05/21 2250    ondansetron (ZOFRAN-ODT) 4 MG disintegrating tablet  Every 8 hours PRN        10/05/21 2250             Note:  This document was prepared using Dragon voice recognition software and may include unintentional dictation errors.    Melvenia Needles, PA-C 10/05/21 2338    Duffy Bruce, MD 10/08/21 1011

## 2022-02-02 ENCOUNTER — Other Ambulatory Visit: Payer: Self-pay | Admitting: Family

## 2022-02-02 DIAGNOSIS — Z1231 Encounter for screening mammogram for malignant neoplasm of breast: Secondary | ICD-10-CM

## 2022-05-05 ENCOUNTER — Ambulatory Visit (INDEPENDENT_AMBULATORY_CARE_PROVIDER_SITE_OTHER): Payer: BC Managed Care – PPO | Admitting: Family

## 2022-05-05 ENCOUNTER — Encounter: Payer: Self-pay | Admitting: Family

## 2022-05-05 VITALS — BP 120/72 | HR 74 | Ht 62.0 in | Wt 144.2 lb

## 2022-05-05 DIAGNOSIS — Z0000 Encounter for general adult medical examination without abnormal findings: Secondary | ICD-10-CM

## 2022-05-05 DIAGNOSIS — Z124 Encounter for screening for malignant neoplasm of cervix: Secondary | ICD-10-CM

## 2022-05-05 DIAGNOSIS — Z01419 Encounter for gynecological examination (general) (routine) without abnormal findings: Secondary | ICD-10-CM

## 2022-05-05 MED ORDER — BUPROPION HCL ER (XL) 150 MG PO TB24: 150.0000 mg | ORAL_TABLET | Freq: Every morning | ORAL | 3 refills | Status: DC

## 2022-05-07 NOTE — Progress Notes (Signed)
Established Patient Office Visit  Subjective:  Patient ID: Erin Wise, female    DOB: 12-23-1960  Age: 62 y.o. MRN: [id number]  Chief Complaint  Patient presents with   Follow-up    PAP   Gynecologic Exam    62 y.o. female for annual routine Pap and checkup.  She has been feeling well in general.  Her only concern today is some vaginal dryness that causes discomfort with sexual activity.   No other concerns today.      Past Medical History:  Diagnosis Date   Allergy    Anxiety    Arthritis    Depression    Heart murmur     Past Surgical History:  Procedure Laterality Date   ABDOMINAL HYSTERECTOMY     CESAREAN SECTION     HERNIA REPAIR      Social History   Socioeconomic History   Marital status: Divorced    Spouse name: Not on file   Number of children: Not on file   Years of education: Not on file   Highest education level: Not on file  Occupational History   Not on file  Tobacco Use   Smoking status: Former    Packs/day: 1.00    Years: 25.00    Total pack years: 25.00    Types: Cigarettes    Quit date: 2020    Years since quitting: 4.1   Smokeless tobacco: Never  Vaping Use   Vaping Use: Never used  Substance and Sexual Activity   Alcohol use: Never   Drug use: Not on file   Sexual activity: Not on file  Other Topics Concern   Not on file  Social History Narrative   Not on file   Social Determinants of Health   Financial Resource Strain: Not on file  Food Insecurity: Not on file  Transportation Needs: Not on file  Physical Activity: Not on file  Stress: Not on file  Social Connections: Not on file  Intimate Partner Violence: Not on file    Family History  Problem Relation Age of Onset   Cancer Mother        lung, kidney, liver and bone   Heart disease Father    Breast cancer Neg Hx     No Known Allergies  Review of Systems  All other systems reviewed and are negative.      Objective:   BP 120/72   Pulse 74    Ht '5\' 2"'$  (1.575 m)   Wt 144 lb 3.2 oz (65.4 kg)   SpO2 98%   BMI 26.37 kg/m   Vitals:   05/05/22 1457  BP: 120/72  Pulse: 74  Height: '5\' 2"'$  (1.575 m)  Weight: 144 lb 3.2 oz (65.4 kg)  SpO2: 98%  BMI (Calculated): 26.37    Physical Exam Vitals and nursing note reviewed. Exam conducted with a chaperone present.  Constitutional:      Appearance: Normal appearance. She is normal weight.  HENT:     Head: Normocephalic.  Eyes:     Pupils: Pupils are equal, round, and reactive to light.  Cardiovascular:     Rate and Rhythm: Normal rate.  Pulmonary:     Effort: Pulmonary effort is normal.  Genitourinary:    General: Normal vulva.     Exam position: Lithotomy position.     Vagina: Normal.     Cervix: Normal.  Neurological:     Mental Status: She is alert.      No results  found for any visits on 05/05/22.  No results found for this or any previous visit (from the past 2160 hour(s)).    Assessment & Plan:   Problem List Items Addressed This Visit   None Visit Diagnoses     Encounter for annual physical exam    -  Primary   Relevant Orders   IGP, Aptima HPV, rfx 16/18,45   Screening for malignant neoplasm of cervix       Encounter for well woman exam with routine gynecological exam           Return in about 1 year (around 05/05/2023).   Total time spent: 30 minutes  [place], FNP  03/07/2024Subjective:     Erin Wise is a 62 y.o. female and is here for a comprehensive physical exam. The patient reports no problems.  Social History   Socioeconomic History   Marital status: Divorced    Spouse name: Not on file   Number of children: Not on file   Years of education: Not on file   Highest education level: Not on file  Occupational History   Not on file  Tobacco Use   Smoking status: Former    Packs/day: 1.00    Years: 25.00    Total pack years: 25.00    Types: Cigarettes    Quit date: 2020    Years since quitting: 4.1   Smokeless  tobacco: Never  Vaping Use   Vaping Use: Never used  Substance and Sexual Activity   Alcohol use: Never   Drug use: Not on file   Sexual activity: Not on file  Other Topics Concern   Not on file  Social History Narrative   Not on file   Social Determinants of Health   Financial Resource Strain: Not on file  Food Insecurity: Not on file  Transportation Needs: Not on file  Physical Activity: Not on file  Stress: Not on file  Social Connections: Not on file  Intimate Partner Violence: Not on file   Health Maintenance  Topic Date Due   HIV Screening  Never done   Hepatitis C Screening  Never done   DTaP/Tdap/Td (1 - Tdap) Never done   PAP SMEAR-Modifier  Never done   COLONOSCOPY (Pts 45-33yr Insurance coverage will need to be confirmed)  Never done   Lung Cancer Screening  Never done   Zoster Vaccines- Shingrix (1 of 2) Never done   INFLUENZA VACCINE  09/28/2021   COVID-19 Vaccine (3 - 2023-24 season) 10/29/2021   MAMMOGRAM  12/18/2021   HPV VACCINES  Aged Out    The following portions of the patient's history were reviewed and updated as appropriate: allergies, current medications, past family history, past medical history, past social history, past surgical history, and problem list.  Review of Systems A comprehensive review of systems was negative.   Objective:    BP 120/72   Pulse 74   Ht '5\' 2"'$  (1.575 m)   Wt 144 lb 3.2 oz (65.4 kg)   SpO2 98%   BMI 26.37 kg/m  General appearance: alert, cooperative, appears stated age, and no distress Head: Normocephalic, without obvious abnormality, atraumatic Eyes: conjunctivae/corneas clear. PERRL, EOM's intact. Fundi benign. Ears: normal TM's and external ear canals both ears Nose: Nares normal. Septum midline. Mucosa normal. No drainage or sinus tenderness. Throat: lips, mucosa, and tongue normal; teeth and gums normal Lungs: clear to auscultation bilaterally Heart: regular rate and rhythm, S1, S2 normal, no murmur,  click,  rub or gallop Pelvic: cervix normal in appearance, external genitalia normal, no adnexal masses or tenderness, no cervical motion tenderness, rectovaginal septum normal, uterus normal size, shape, and consistency, and vagina normal without discharge Extremities: extremities normal, atraumatic, no cyanosis or edema Skin: Skin color, texture, turgor normal. No rashes or lesions     Assessment & Plan  Smanatha was seen today for follow-up and gynecologic exam.  Diagnoses and all orders for this visit:  Encounter for annual physical exam -     IGP, Aptima HPV, rfx 16/18,45  Screening for malignant neoplasm of cervix  Encounter for well woman exam with routine gynecological exam  Other orders -     buPROPion (WELLBUTRIN XL) 150 MG 24 hr tablet; Take 1 tablet (150 mg total) by mouth every morning.   Return in about 1 year (around 05/05/2023).  Total time spent: 30 minutes  [place], [place]

## 2022-05-11 ENCOUNTER — Other Ambulatory Visit: Payer: Self-pay

## 2022-05-11 MED ORDER — PAROXETINE HCL 30 MG PO TABS: 30.0000 mg | ORAL_TABLET | Freq: Every morning | ORAL | 3 refills | Status: DC

## 2022-05-24 LAB — IGP, APTIMA HPV, RFX 16/18,45
HPV Aptima: NEGATIVE
PAP Smear Comment: 0

## 2022-05-24 LAB — SPECIMEN STATUS REPORT

## 2022-05-26 ENCOUNTER — Encounter: Payer: Self-pay | Admitting: Family

## 2022-07-05 ENCOUNTER — Ambulatory Visit
Admission: RE | Admit: 2022-07-05 | Discharge: 2022-07-05 | Disposition: A | Discharge status: Home / Self Care | Payer: BC Managed Care – PPO | Source: Ambulatory Visit | Attending: Family | Admitting: Family

## 2022-07-05 DIAGNOSIS — Z1231 Encounter for screening mammogram for malignant neoplasm of breast: Secondary | ICD-10-CM | POA: Insufficient documentation

## 2022-07-12 ENCOUNTER — Emergency Department: Payer: BC Managed Care – PPO

## 2022-07-12 ENCOUNTER — Other Ambulatory Visit: Payer: Self-pay

## 2022-07-12 ENCOUNTER — Encounter: Payer: Self-pay | Admitting: Emergency Medicine

## 2022-07-12 ENCOUNTER — Emergency Department
Admission: EM | Admit: 2022-07-12 | Discharge: 2022-07-12 | Disposition: A | Discharge status: Home / Self Care | Payer: BC Managed Care – PPO | Attending: Emergency Medicine | Admitting: Emergency Medicine

## 2022-07-12 DIAGNOSIS — R197 Diarrhea, unspecified: Secondary | ICD-10-CM | POA: Diagnosis not present

## 2022-07-12 DIAGNOSIS — R1032 Left lower quadrant pain: Secondary | ICD-10-CM | POA: Insufficient documentation

## 2022-07-12 DIAGNOSIS — R110 Nausea: Secondary | ICD-10-CM | POA: Insufficient documentation

## 2022-07-12 DIAGNOSIS — R109 Unspecified abdominal pain: Secondary | ICD-10-CM

## 2022-07-12 LAB — CBC
HCT: 42.9 % (ref 36.0–46.0)
Hemoglobin: 14.5 g/dL (ref 12.0–15.0)
MCH: 29.5 pg (ref 26.0–34.0)
MCHC: 33.8 g/dL (ref 30.0–36.0)
MCV: 87.2 fL (ref 80.0–100.0)
Platelets: 384 10*3/uL (ref 150–400)
RBC: 4.92 MIL/uL (ref 3.87–5.11)
RDW: 12.4 % (ref 11.5–15.5)
WBC: 7.5 10*3/uL (ref 4.0–10.5)
nRBC: 0 % (ref 0.0–0.2)

## 2022-07-12 LAB — COMPREHENSIVE METABOLIC PANEL
ALT: 22 U/L (ref 0–44)
AST: 38 U/L (ref 15–41)
Albumin: 4.7 g/dL (ref 3.5–5.0)
Alkaline Phosphatase: 52 U/L (ref 38–126)
Anion gap: 12 (ref 5–15)
BUN: 8 mg/dL (ref 6–20)
CO2: 21 mmol/L — ABNORMAL LOW (ref 22–32)
Calcium: 10.2 mg/dL (ref 8.9–10.3)
Chloride: 103 mmol/L (ref 98–111)
Creatinine, Ser: 0.86 mg/dL (ref 0.44–1.00)
GFR, Estimated: >60 mL/min (ref >60)
Glucose, Bld: 101 mg/dL — ABNORMAL HIGH (ref 70–99)
Potassium: 3.7 mmol/L (ref 3.5–5.1)
Sodium: 136 mmol/L (ref 135–145)
Total Bilirubin: 0.8 mg/dL (ref 0.3–1.2)
Total Protein: 7.9 g/dL (ref 6.5–8.1)

## 2022-07-12 LAB — LIPASE, BLOOD: Lipase: 38 U/L (ref 11–51)

## 2022-07-12 MED ORDER — PROMETHAZINE HCL 12.5 MG PO TABS: 12.5000 mg | ORAL_TABLET | Freq: Four times a day (QID) | ORAL | 0 refills | Status: AC | PRN

## 2022-07-12 MED ORDER — ONDANSETRON HCL 4 MG PO TABS: 4.0000 mg | ORAL_TABLET | Freq: Four times a day (QID) | ORAL | 0 refills | Status: AC | PRN

## 2022-07-12 MED ORDER — DICYCLOMINE HCL 20 MG PO TABS: 20.0000 mg | ORAL_TABLET | Freq: Four times a day (QID) | ORAL | 0 refills | Status: AC | PRN

## 2022-07-12 MED ORDER — ONDANSETRON HCL 4 MG/2ML IJ SOLN
4.0000 mg | Freq: Once | INTRAMUSCULAR | Status: AC
  Administered 2022-07-12: 4 mg via INTRAVENOUS
  Filled 2022-07-12: qty 2

## 2022-07-12 MED ORDER — MORPHINE SULFATE (PF) 4 MG/ML IV SOLN
4.0000 mg | Freq: Once | INTRAVENOUS | Status: AC
  Administered 2022-07-12: 4 mg via INTRAVENOUS
  Filled 2022-07-12: qty 1

## 2022-07-12 MED ORDER — SODIUM CHLORIDE 0.9 % IV BOLUS
1000.0000 mL | Freq: Once | INTRAVENOUS | Status: AC
  Administered 2022-07-12: 1000 mL via INTRAVENOUS

## 2022-07-12 MED ORDER — IOHEXOL 300 MG/ML  SOLN
100.0000 mL | Freq: Once | INTRAMUSCULAR | Status: AC | PRN
  Administered 2022-07-12: 100 mL via INTRAVENOUS

## 2022-07-12 NOTE — Discharge Instructions (Addendum)
You were seen in the Emergency Department today for evaluation of your abdominal pain. Fortunately, your labs and CT scan were overall reassuring against an emergency cause for your pain.  It did show some mild inflammation of your colon called colitis which could be related to a GI illness.  Please follow-up with your primary care doctor within the next few days for reevaluation. Return to the ER for any new or worsening symptoms including worsening pain, inability to tolerate food or liquids, or any other new or concerning symptoms.   I sent prescription for 2 different nausea medicines to your pharmacy as well as a anticramping medicine. You can take these as needed to help with your symptoms.The Phenergan can make you drowsy, do not drive or operate machinery on taking this.

## 2022-07-12 NOTE — ED Provider Notes (Signed)
[facility] Provider Note    Event Date/Time   First MD Initiated Contact with Patient 07/12/22 [phone]     (approximate)   History   Diarrhea   HPI  Erin Wise is a 62 y.o. female with history of diverticulitis presenting to the emergency department for evaluation of abdominal pain and diarrhea.  Patient reports that on Saturday she had onset of lower abdominal pain with multiple episodes of nonbloody diarrhea and nausea.  Has had limited p.o. intake since that time.  Concerned about dehydration.     Physical Exam   Triage Vital Signs: ED Triage Vitals  Enc Vitals Group     BP 07/12/22 0851 (!) 123/99     Pulse Rate 07/12/22 0851 84     Resp 07/12/22 0851 20     Temp 07/12/22 0851 97.6 F (36.4 C)     Temp src --      SpO2 07/12/22 0852 97 %     Weight 07/12/22 0853 145 lb (65.8 kg)     Height 07/12/22 0853 5' 1.25" (1.556 m)     Head Circumference --      Peak Flow --      Pain Score 07/12/22 0853 1     Pain Loc --      Pain Edu? --      Excl. in GC? --     Most recent vital signs: Vitals:   07/12/22 0851 07/12/22 0852  BP: (!) 123/99   Pulse: 84   Resp: 20   Temp: 97.6 F (36.4 C)   SpO2:  97%     General: Awake, interactive  CV:  Regular rate, good peripheral perfusion.  Resp:  Lungs clear, unlabored respirations.  Abd:  Soft, nondistended, tender to palpation in the lower abdomen more so on the left, no rebound or guarding Neuro:  Symmetric facial movement, fluid speech   ED Results / Procedures / Treatments   Labs (all labs ordered are listed, but only abnormal results are displayed) Labs Reviewed  COMPREHENSIVE METABOLIC PANEL - Abnormal; Notable for the following components:      Result Value   CO2 21 (*)    Glucose, Bld 101 (*)    All other components within normal limits  LIPASE, BLOOD  CBC     EKG EKG independently reviewed interpreted by myself (ER attending) demonstrates:    RADIOLOGY Imaging  independently reviewed and interpreted by myself demonstrates:  CT abdomen pelvis without evidence of diverticulitis, mild inflammation in the colon noted by radiology  PROCEDURES:  Critical Care performed: No  Procedures   MEDICATIONS ORDERED IN ED: Medications  sodium chloride 0.9 % bolus 1,000 mL (1,000 mLs Intravenous New Bag/Given 07/12/22 0948)  morphine (PF) 4 MG/ML injection 4 mg (4 mg Intravenous Given 07/12/22 0946)  ondansetron (ZOFRAN) injection 4 mg (4 mg Intravenous Given 07/12/22 0947)  iohexol (OMNIPAQUE) 300 MG/ML solution 100 mL (100 mLs Intravenous Contrast Given 07/12/22 0958)     IMPRESSION / MDM / ASSESSMENT AND PLAN / ED COURSE  I reviewed the triage vital signs and the nursing notes.  Differential diagnosis includes, but is not limited to, viral GI illness, diverticulitis, colitis, UTI, other acute intra-abdominal process  Patient's presentation is most consistent with acute presentation with potential threat to life or bodily function.  62 year old female presenting with abdominal pain and diarrhea.  Labs sent from triage.  Will provide IV fluids, morphine, Zofran and obtain CT abdomen pelvis to further evaluate.  CT abdomen pelvis with mild thickening of the colon, otherwise without acute abnormality.  Labs reassuring.  Patient without urinary symptoms.  On reevaluation, patient feels improved.  Updated on results of her workup.  She is comfortable with discharge home.  Will DC with prescription for Zofran and Phenergan for nausea and Bentyl for cramping.  She will call the primary care doctor for further evaluation.  Strict return precautions provided.      FINAL CLINICAL IMPRESSION(S) / ED DIAGNOSES   Final diagnoses:  Acute abdominal pain  Diarrhea, unspecified type     Rx / DC Orders   ED Discharge Orders          Ordered    dicyclomine (BENTYL) 20 MG tablet  Every 6 hours PRN        07/12/22 1101    ondansetron (ZOFRAN) 4 MG tablet  Every 6  hours PRN        07/12/22 1101    promethazine (PHENERGAN) 12.5 MG tablet  Every 6 hours PRN        07/12/22 1101             Note:  This document was prepared using Dragon voice recognition software and may include unintentional dictation errors.   Trinna Post, MD 07/12/22 1102

## 2022-07-12 NOTE — ED Triage Notes (Signed)
Pt to ED POV for diarrhea and nausea started yesterday. Denies emesis or fevers. Reports hx of "stomach issues"

## 2023-05-01 ENCOUNTER — Other Ambulatory Visit: Payer: Self-pay | Admitting: Family

## 2023-05-08 ENCOUNTER — Encounter: Payer: Self-pay | Admitting: Family

## 2023-05-08 ENCOUNTER — Ambulatory Visit (INDEPENDENT_AMBULATORY_CARE_PROVIDER_SITE_OTHER): Payer: BC Managed Care – PPO | Admitting: Family

## 2023-05-08 VITALS — BP 122/84 | HR 65 | Ht 61.0 in | Wt 146.8 lb

## 2023-05-08 DIAGNOSIS — Z0001 Encounter for general adult medical examination with abnormal findings: Secondary | ICD-10-CM | POA: Diagnosis not present

## 2023-05-08 DIAGNOSIS — Z124 Encounter for screening for malignant neoplasm of cervix: Secondary | ICD-10-CM

## 2023-05-08 DIAGNOSIS — Z1272 Encounter for screening for malignant neoplasm of vagina: Secondary | ICD-10-CM

## 2023-05-08 DIAGNOSIS — Z1151 Encounter for screening for human papillomavirus (HPV): Secondary | ICD-10-CM | POA: Diagnosis not present

## 2023-05-08 DIAGNOSIS — Z0000 Encounter for general adult medical examination without abnormal findings: Secondary | ICD-10-CM

## 2023-05-08 DIAGNOSIS — Z01419 Encounter for gynecological examination (general) (routine) without abnormal findings: Secondary | ICD-10-CM

## 2023-05-08 NOTE — Progress Notes (Signed)
 Complete physical exam  Patient: Erin Wise   DOB: 03-30-1960   63 y.o. Female  MRN: [id number]  Subjective:    Chief Complaint  Patient presents with   Annual Exam    CPE and PAP    Erin Wise is a 63 y.o. female who presents today for a complete physical exam. She reports consuming a general and low fat diet. Home exercise routine includes exercise at home. She generally feels fairly well. She reports sleeping fairly well. She does have additional problems to discuss today.    Most recent fall risk assessment:    08/03/2017    1:40 PM  Fall Risk   Falls in the past year? No     Most recent depression screenings:    05/08/2023    4:11 PM 08/03/2017    1:40 PM  PHQ 2/9 Scores  PHQ - 2 Score 1 2  PHQ- 9 Score 3 7      Past Medical History:  Diagnosis Date   Allergy    Anxiety    Arthritis    Depression    Heart murmur     Past Surgical History:  Procedure Laterality Date   ABDOMINAL HYSTERECTOMY     CESAREAN SECTION     HERNIA REPAIR      Family History  Problem Relation Age of Onset   Cancer Mother        lung, kidney, liver and bone   Heart disease Father    Breast cancer Neg Hx     Social History   Socioeconomic History   Marital status: Divorced    Spouse name: Not on file   Number of children: Not on file   Years of education: Not on file   Highest education level: Not on file  Occupational History   Not on file  Tobacco Use   Smoking status: Former    Current packs/day: 0.00    Average packs/day: 1 pack/day for 25.0 years (25.0 ttl pk-yrs)    Types: Cigarettes    Start date: 75    Quit date: 2020    Years since quitting: 5.1   Smokeless tobacco: Never  Vaping Use   Vaping status: Never Used  Substance and Sexual Activity   Alcohol use: Never   Drug use: Never   Sexual activity: Not Currently  Other Topics Concern   Not on file  Social History Narrative   Not on file   Social Drivers of Health   Financial  Resource Strain: Not on file  Food Insecurity: Not on file  Transportation Needs: Not on file  Physical Activity: Not on file  Stress: Not on file  Social Connections: Not on file  Intimate Partner Violence: Not on file    Outpatient Medications Prior to Visit  Medication Sig   albuterol (PROVENTIL HFA;VENTOLIN HFA) 108 (90 Base) MCG/ACT inhaler Inhale 2 puffs into the lungs every 6 (six) hours as needed for wheezing or shortness of breath.   buPROPion (WELLBUTRIN XL) 150 MG 24 hr tablet TAKE 1 TABLET BY MOUTH ONCE DAILY IN THE MORNING   cyclobenzaprine (FLEXERIL) 10 MG tablet Take 0.5 tablets (5 mg total) by mouth 3 (three) times daily as needed for muscle spasms.   dicyclomine (BENTYL) 20 MG tablet Take 1 tablet (20 mg total) by mouth every 6 (six) hours as needed for up to 7 days for spasms.   levocetirizine (XYZAL) 5 MG tablet Take 5 mg by mouth every evening.  Magnesium 250 MG TABS Take by mouth.   Multiple Vitamin (MULTI-VITAMINS) TABS Take by mouth.   ondansetron (ZOFRAN-ODT) 4 MG disintegrating tablet Take 1 tablet (4 mg total) by mouth every 8 (eight) hours as needed for nausea or vomiting.   PARoxetine (PAXIL) 30 MG tablet Take 1 tablet (30 mg total) by mouth every morning.   traZODone (DESYREL) 150 MG tablet TAKE 1 TABLET BY MOUTH AT BEDTIME *NEEDS TO SCHEDULE FOLLOW UP IN THE NEXT 4 WEEKS*   vitamin B-12 (CYANOCOBALAMIN) 1000 MCG tablet Take by mouth.   estradiol (ESTRACE) 1 MG tablet TAKE ONE TABLET BY MOUTH ONCE DAILY (Patient not taking: Reported on 05/08/2023)   promethazine (PHENERGAN) 12.5 MG tablet Take 1 tablet (12.5 mg total) by mouth every 6 (six) hours as needed for up to 7 days for nausea or vomiting. (Patient not taking: Reported on 05/08/2023)   REXULTI 1 MG TABS tablet Take 1 mg by mouth daily. (Patient not taking: Reported on 05/08/2023)   No facility-administered medications prior to visit.    Review of Systems  All other systems reviewed and are  negative.       Objective:     BP 122/84   Pulse 65   Ht 5\' 1"  (1.549 m)   Wt 146 lb 12.8 oz (66.6 kg)   SpO2 95%   BMI 27.74 kg/m    Physical Exam Vitals reviewed. Exam conducted with a chaperone present.  Constitutional:      Appearance: Normal appearance. She is normal weight.  Cardiovascular:     Rate and Rhythm: Normal rate and regular rhythm.  Pulmonary:     Effort: Pulmonary effort is normal.     Breath sounds: Normal breath sounds.  Genitourinary:    General: Normal vulva.     Exam position: Lithotomy position.     Vagina: Normal.     Cervix: Normal.  Musculoskeletal:        General: Normal range of motion.     Cervical back: Normal range of motion.  Neurological:     General: No focal deficit present.     Mental Status: She is alert and oriented to person, place, and time. Mental status is at baseline.      No results found for any visits on 05/08/23.  No results found for this or any previous visit (from the past 2160 hours).      Assessment & Plan:    Routine Health Maintenance and Physical Exam  Immunization History  Administered Date(s) Administered   Influenza-Unspecified 12/09/2017   PFIZER(Purple Top)SARS-COV-2 Vaccination 05/31/2019, 06/26/2019   Pneumococcal Polysaccharide-23 12/09/2017    Health Maintenance  Topic Date Due   HIV Screening  Never done   Hepatitis C Screening  Never done   DTaP/Tdap/Td (1 - Tdap) Never done   Colonoscopy  Never done   Lung Cancer Screening  Never done   Zoster Vaccines- Shingrix (1 of 2) Never done   INFLUENZA VACCINE  09/29/2022   COVID-19 Vaccine (3 - 2024-25 season) 10/30/2022   MAMMOGRAM  07/04/2024   Cervical Cancer Screening (HPV/Pap Cotest)  05/05/2027   Pneumococcal Vaccine 105-73 Years old  Aged Out   HPV VACCINES  Aged Out    Discussed health benefits of physical activity, and encouraged her to engage in regular exercise appropriate for her age and condition.  Problem List Items  Addressed This Visit   None Visit Diagnoses       Vaginal Pap smear    -  Primary  Relevant Orders   IGP, Aptima HPV     Screening for human papillomavirus (HPV)       Relevant Orders   IGP, Aptima HPV     Screening for cervical cancer       Relevant Orders   IGP, Aptima HPV      No follow-ups on file.     Erin Kins, FNP  05/08/2023   This document may have been prepared by [facility] Voice Recognition software and as such may include unintentional dictation errors.

## 2023-05-17 LAB — IGP, APTIMA HPV
HPV Aptima: NEGATIVE
PAP Smear Comment: 0

## 2023-07-27 ENCOUNTER — Other Ambulatory Visit: Payer: Self-pay | Admitting: Family

## 2023-08-10 ENCOUNTER — Other Ambulatory Visit: Payer: Self-pay

## 2023-08-10 MED ORDER — TRAZODONE HCL 150 MG PO TABS: 150.0000 mg | ORAL_TABLET | Freq: Every evening | ORAL | 3 refills | Status: AC | PRN

## 2023-08-10 MED ORDER — PAROXETINE HCL 30 MG PO TABS: 30.0000 mg | ORAL_TABLET | Freq: Every morning | ORAL | 3 refills | Status: AC

## 2023-10-28 ENCOUNTER — Other Ambulatory Visit: Payer: Self-pay | Admitting: Family

## 2024-01-26 ENCOUNTER — Other Ambulatory Visit: Payer: Self-pay | Admitting: Family

## 2024-05-07 ENCOUNTER — Encounter: Admitting: Family
